# Patient Record
Sex: Male | Born: 1960
Health system: Southern US, Community
[De-identification: ages and names within clinical notes are randomized; demographics above are authoritative.]

## PROBLEM LIST (undated history)

## (undated) DIAGNOSIS — R569 Unspecified convulsions: Secondary | ICD-10-CM

## (undated) DIAGNOSIS — I10 Essential (primary) hypertension: Secondary | ICD-10-CM

## (undated) HISTORY — DX: Essential (primary) hypertension: I10

## (undated) HISTORY — PX: OTHER SURGICAL HISTORY: SHX169

## (undated) HISTORY — DX: Unspecified convulsions: R56.9

---

## 2000-09-14 ENCOUNTER — Encounter: Payer: Self-pay | Admitting: Emergency Medicine

## 2000-09-14 ENCOUNTER — Observation Stay (HOSPITAL_COMMUNITY): Admission: EM | Admit: 2000-09-14 | Discharge: 2000-09-15 | Payer: Self-pay | Admitting: Emergency Medicine

## 2008-06-10 ENCOUNTER — Ambulatory Visit: Payer: Self-pay | Admitting: Surgery

## 2008-06-14 ENCOUNTER — Emergency Department (HOSPITAL_COMMUNITY): Admission: EM | Admit: 2008-06-14 | Discharge: 2008-06-14 | Payer: Self-pay | Admitting: Emergency Medicine

## 2008-06-30 ENCOUNTER — Ambulatory Visit (HOSPITAL_COMMUNITY): Admission: RE | Admit: 2008-06-30 | Discharge: 2008-06-30 | Payer: Self-pay | Admitting: Cardiology

## 2008-06-30 ENCOUNTER — Encounter (INDEPENDENT_AMBULATORY_CARE_PROVIDER_SITE_OTHER): Payer: Self-pay | Admitting: Cardiology

## 2008-08-03 ENCOUNTER — Ambulatory Visit (HOSPITAL_COMMUNITY): Admission: RE | Admit: 2008-08-03 | Discharge: 2008-08-03 | Payer: Self-pay | Admitting: Rheumatology

## 2010-07-27 LAB — POCT I-STAT, CHEM 8
BUN: 13 mg/dL (ref 6–23)
Calcium, Ion: 1.26 mmol/L (ref 1.12–1.32)
Chloride: 104 mEq/L (ref 96–112)
Creatinine, Ser: 1.1 mg/dL (ref 0.4–1.5)
Glucose, Bld: 87 mg/dL (ref 70–99)
HCT: 46 % (ref 39.0–52.0)
Hemoglobin: 15.6 g/dL (ref 13.0–17.0)
Potassium: 4.4 mEq/L (ref 3.5–5.1)
Sodium: 139 mEq/L (ref 135–145)
TCO2: 30 mmol/L (ref 0–100)

## 2010-07-27 LAB — DIFFERENTIAL
Basophils Absolute: 0.2 10*3/uL — ABNORMAL HIGH (ref 0.0–0.1)
Basophils Relative: 2 % — ABNORMAL HIGH (ref 0–1)
Eosinophils Absolute: 0.1 10*3/uL (ref 0.0–0.7)
Eosinophils Relative: 1 % (ref 0–5)
Lymphocytes Relative: 23 % (ref 12–46)
Lymphs Abs: 2.1 10*3/uL (ref 0.7–4.0)
Monocytes Absolute: 1.1 10*3/uL — ABNORMAL HIGH (ref 0.1–1.0)
Monocytes Relative: 12 % (ref 3–12)
Neutro Abs: 5.6 10*3/uL (ref 1.7–7.7)
Neutrophils Relative %: 62 % (ref 43–77)

## 2010-07-27 LAB — CBC
HCT: 44.4 % (ref 39.0–52.0)
Hemoglobin: 15.4 g/dL (ref 13.0–17.0)
MCHC: 34.8 g/dL (ref 30.0–36.0)
MCV: 95.1 fL (ref 78.0–100.0)
Platelets: 321 10*3/uL (ref 150–400)
RBC: 4.66 MIL/uL (ref 4.22–5.81)
RDW: 13 % (ref 11.5–15.5)
WBC: 9.1 10*3/uL (ref 4.0–10.5)

## 2010-07-27 LAB — D-DIMER, QUANTITATIVE: D-Dimer, Quant: 0.3 ug/mL-FEU (ref 0.00–0.48)

## 2010-08-29 NOTE — Procedures (Signed)
DUPLEX DEEP VENOUS EXAM - LOWER EXTREMITY   INDICATION:  Left lower extremity pain and swelling.   HISTORY:  Edema:  Yes.  Trauma/Surgery:  No.  Pain:  Yes.  PE:  No.  Previous DVT:  No.  Anticoagulants:  Other:   DUPLEX EXAM:                CFV   SFV   PopV  PTV    GSV                R  L  R  L  R  L  R   L  R  L  Thrombosis    0  0     0     0      0     0  Spontaneous   +  +     +     +      +     +  Phasic        +  +     +     +      +     +  Augmentation  +  +     +     +      +     +  Compressible  +  +     +     +      +     +  Competent     +  +     +     +      +     +   Legend:  + - yes  o - no  p - partial  D - decreased   IMPRESSION:  1. No evidence of DVT noted in the left leg.  2. Notified Kennon Rounds with results.    _____________________________  V. Charlena Cross, MD   MG/MEDQ  D:  06/10/2008  T:  06/10/2008  Job:  [id number]

## 2010-09-01 NOTE — H&P (Signed)
[place]  Patient:    John Mcneil, John Mcneil                       MRN: [id number] Adm. Date:  [id number] Disc. Date: [id number] Attending:  Norman Clay                         History and Physical  REASON FOR ADMISSION:  Chest pain.  HISTORY:  A 50 year old male with a known history of seizure disorder and smoking.  He has episodic indigestion type pain after eating hot dogs or precipitated by food.  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  He developed chest discomfort about an hour after eating a very large breakfast this morning.  Discomfort was described pressure/tightness in his mid and upper chest area which would wax and wane through the morning.  He took Rolaids.  The pain would be intermittent.  There is possibly some relief with belching.  The pain persisted on and off throughout the day and then it became associated with some sweating of his hands.  He became frightened after this and came to the emergency room.  EKG was unremarkable.  He was given multiple nitroglycerin, but did not appreciably change the pain and was eventually given a GI cocktail.  The pain went away about an hour after taking GI cocktail and nitroglycerin.  He normally is able to hunt and be active and does not normally have exertional chest pain and can do unlimited activities. Cholesterol status is unknown.  PAST MEDICAL HISTORY:  Remarkable for a seizure disorder.  Last seizure was around age 43.  No family doctor.  No hypertension, diabetes, or lipid disorder.  PAST SURGICAL HISTORY:  None.  ALLERGIES:  None.  MEDICATIONS:  Tegretol and phenobarbital.  FAMILY HISTORY:  Father is a diabetic who has had coronary artery bypass grafting.  Mother has had hypertension.  SOCIAL HISTORY:  He has been married twice.  No children the first marriage. Married to current wife for three years.  She has two children by  previous marriage.  He smokes a pack and a half of cigarettes per day for 18 years. Drinks rare alcohol.  Works as a Science writer for [employer].  REVIEW OF SYSTEMS:  He is not obese.  No ENT complaints.  He has noted some difficulty swallowing earlier today.  He does not have diarrhea or constipation.  No GU symptoms or problems.  No claudication or TIAs. Remainder of review of systems unremarkable.  PHYSICAL EXAMINATION  GENERAL:  Pleasant male in no acute distress.  VITAL SIGNS:  Blood pressure 130/80, pulse 76.  SKIN:  Warm and dry.  HEENT:  Normal.  Pharynx:  Negative.  NECK:  No carotid bruits.  Supple without masses, thyromegaly.  LUNGS:  Clear.  CARDIAC:  Normal S1, S2.  No S3 or murmur.  ABDOMEN:  Soft and nontender.  EXTREMITIES:  Femoral and distal pulses 2+.  No edema.  LABORATORIES:  A 12-lead ECG is normal.  Laboratories pending at time of dictation.  IMPRESSION: 1. Prolonged chest discomfort with atypical features, rule out angina.  Rule    out gastrointestinal source for pain. 2. Seizure disorder. 3. Smoker.  RECOMMENDATIONS:  Rule out MI unit.  Given Lovenox, aspirin, beta blockers. He is currently pain free.  Check serial enzymes.  If negative consider treadmill testing.  Add Protonix to regimen.  I have asked him to stop smoking.  Discussed methods of doing so. DD:  09/15/00 TD:  09/15/00 Job: [id number]

## 2013-02-23 ENCOUNTER — Other Ambulatory Visit: Payer: Self-pay | Admitting: Internal Medicine

## 2013-02-23 DIAGNOSIS — R319 Hematuria, unspecified: Secondary | ICD-10-CM

## 2013-03-18 ENCOUNTER — Other Ambulatory Visit: Payer: Self-pay

## 2013-04-28 ENCOUNTER — Encounter: Payer: Self-pay | Admitting: Internal Medicine

## 2013-08-18 ENCOUNTER — Ambulatory Visit (INDEPENDENT_AMBULATORY_CARE_PROVIDER_SITE_OTHER): Payer: 59 | Admitting: Family Medicine

## 2013-08-18 VITALS — BP 138/78 | HR 93 | Temp 98.8°F | Resp 18 | Ht 71.0 in | Wt 191.0 lb

## 2013-08-18 DIAGNOSIS — H9201 Otalgia, right ear: Secondary | ICD-10-CM

## 2013-08-18 DIAGNOSIS — H9209 Otalgia, unspecified ear: Secondary | ICD-10-CM

## 2013-08-18 NOTE — Progress Notes (Signed)
   Subjective:    Patient ID: John Mcneil, male    DOB: 1961/03/26, 53 y.o.   MRN: [id number]  HPI Patient presents today with 3 days of intermittent right ear pain. He has been using OTC drops for swimmers ear. Has noticed decreased hearing in right ear.Uses cotton swabs in ears daily.  Had ear infection 6 month ago and was given drops which resolved problem.   Receives regular primary care. Had physical last month. Has had intermittent elevated blood pressure readings.  PMH- childhood seizures PSH- none  SH- smokes almost 1 ppd for 20 years, works for [employer], enjoys his work, will be retiring 2/15.  Review of Systems No fever, no sore throat, no nasal congestion.    Objective:   Physical Exam  Vitals reviewed. Constitutional: He is oriented to person, place, and time. He appears well-developed and well-nourished. No distress.  HENT:  Head: Normocephalic and atraumatic.  Right Ear: External ear normal.  Left Ear: Tympanic membrane, external ear and ear canal normal.  Nose: Nose normal.  Mouth/Throat: Oropharynx is clear and moist.  Right ear with irritated, narrowed canal. Unable to visualize TM due to cerumen. Slightly tender to exam. Left ear with moderate amount cerumen, able to visualize TM.  Eyes: Conjunctivae are normal. Right eye exhibits no discharge. Left eye exhibits no discharge.  Neck: Normal range of motion. Neck supple.  Cardiovascular: Normal rate, regular rhythm and normal heart sounds.   Pulmonary/Chest: Effort normal and breath sounds normal.  Musculoskeletal: Normal range of motion.  Neurological: He is alert and oriented to person, place, and time.  Skin: Skin is warm and dry. He is not diaphoretic.  Psychiatric: He has a normal mood and affect. His behavior is normal. Judgment and thought content normal.   Right ear irrigated with good results. Improved hearing and decreased feeling of pressure noted.   Recheck of BP normal.     Assessment & Plan:  1.  Otalgia of right ear -improved with irrigation. Discussed ear care with patient, encouraged discontinuation of cotton swab use.   2. Elevated BP reading -Initial BP reading elevated, decreased as visit progressed. Discussed this with patient. He is to have BP rechecked in 1 month.   Emi Belfasteborah B. Gessner, FNP-BC  [facility], [facility]  08/18/2013 10:41 AM

## 2017-09-23 DIAGNOSIS — H66009 Acute suppurative otitis media without spontaneous rupture of ear drum, unspecified ear: Secondary | ICD-10-CM | POA: Diagnosis not present

## 2019-03-26 ENCOUNTER — Emergency Department (HOSPITAL_COMMUNITY): Payer: 59

## 2019-03-26 ENCOUNTER — Emergency Department (HOSPITAL_COMMUNITY)
Admission: EM | Admit: 2019-03-26 | Discharge: 2019-03-26 | Disposition: A | Discharge status: Home / Self Care | Payer: 59 | Attending: Emergency Medicine | Admitting: Emergency Medicine

## 2019-03-26 ENCOUNTER — Encounter (HOSPITAL_COMMUNITY): Payer: Self-pay

## 2019-03-26 ENCOUNTER — Other Ambulatory Visit: Payer: Self-pay

## 2019-03-26 DIAGNOSIS — E860 Dehydration: Secondary | ICD-10-CM | POA: Insufficient documentation

## 2019-03-26 DIAGNOSIS — I10 Essential (primary) hypertension: Secondary | ICD-10-CM | POA: Insufficient documentation

## 2019-03-26 DIAGNOSIS — F17210 Nicotine dependence, cigarettes, uncomplicated: Secondary | ICD-10-CM | POA: Diagnosis not present

## 2019-03-26 DIAGNOSIS — R0789 Other chest pain: Secondary | ICD-10-CM | POA: Diagnosis not present

## 2019-03-26 LAB — CBC
HCT: 50.9 % (ref 39.0–52.0)
Hemoglobin: 17.8 g/dL — ABNORMAL HIGH (ref 13.0–17.0)
MCH: 32 pg (ref 26.0–34.0)
MCHC: 35 g/dL (ref 30.0–36.0)
MCV: 91.5 fL (ref 80.0–100.0)
Platelets: 313 10*3/uL (ref 150–400)
RBC: 5.56 MIL/uL (ref 4.22–5.81)
RDW: 12.5 % (ref 11.5–15.5)
WBC: 9.2 10*3/uL (ref 4.0–10.5)
nRBC: 0 % (ref 0.0–0.2)

## 2019-03-26 LAB — BASIC METABOLIC PANEL
Anion gap: 10 (ref 5–15)
BUN: 14 mg/dL (ref 6–20)
CO2: 24 mmol/L (ref 22–32)
Calcium: 9.4 mg/dL (ref 8.9–10.3)
Chloride: 102 mmol/L (ref 98–111)
Creatinine, Ser: 1.35 mg/dL — ABNORMAL HIGH (ref 0.61–1.24)
GFR calc Af Amer: >60 mL/min (ref >60)
GFR calc non Af Amer: 58 mL/min — ABNORMAL LOW (ref >60)
Glucose, Bld: 126 mg/dL — ABNORMAL HIGH (ref 70–99)
Potassium: 3.6 mmol/L (ref 3.5–5.1)
Sodium: 136 mmol/L (ref 135–145)

## 2019-03-26 LAB — TROPONIN I (HIGH SENSITIVITY): Troponin I (High Sensitivity): 5 ng/L (ref <18)

## 2019-03-26 MED ORDER — NICOTINE 7 MG/24HR TD PT24
7.0000 mg | MEDICATED_PATCH | Freq: Once | TRANSDERMAL | Status: DC
  Administered 2019-03-26: 15:00:00 7 mg via TRANSDERMAL
  Filled 2019-03-26: qty 1

## 2019-03-26 NOTE — ED Provider Notes (Addendum)
[facility] EMERGENCY DEPARTMENT Provider Note   CSN: [id number] Arrival date & time: 03/26/19  1322     History Chief Complaint  Patient presents with  . Hypertension    John Mcneil is a 58 y.o. male   HPI  Patient is 58 year old male who does not see primary care regularly-last seen 5 years ago.  Presents today with no current symptoms.  Patient states that 6 days ago on Friday night he had an episode where he woke up with chest discomfort that felt like indigestion felt nauseous, sweaty and flushed states that he felt warm and his face.  Patient states that he passed some flatulence and felt much improved but did experience 5 to 7 seconds of left armpit pain.  This did not radiate he denies any chest pressure or shortness of breath during this episode.  Patient states he has had no similar episodes since denies any exertional shortness of breath or chest pain is exertional discomfort denies any recurrent indigestion.  States he has felt somewhat anxious since this episode however and last night felt very anxious and was unable to sleep.  Denies any sensation of heart palpitations.  Patient states that his symptoms felt like he had "high blood pressure "states that he has been told he has high blood pressure before and has had episodes of somewhat similar symptoms however states that he is not on any medications for his blood pressure currently.  Patient states that he drinks very little water primarily drinks caffeinated sodas.  States he has not had water in several days.  Smokes daily.  No illicit drug use infrequent alcohol use.  Patient states he is a generally anxious person but denies any acute anxiety inducing events.  States he is retired and has been for several years now.  States he had an echocardiogram done several years ago and was told that he had no cardiac issues was signed off by cardiology.  Patient is unsure where he had this done.     Past  Medical History:  Diagnosis Date  . Seizures (HCC)     There are no problems to display for this patient.   Past Surgical History:  Procedure Laterality Date  . spinal tap         History reviewed. No pertinent family history.  Social History   Tobacco Use  . Smoking status: Current Every Day Smoker    Packs/day: 0.50    Types: Cigarettes  Substance Use Topics  . Alcohol use: Yes    Comment: rare  . Drug use: Never    Home Medications Prior to Admission medications   Not on File    Allergies    Patient has no known allergies.  Review of Systems   Review of Systems  Constitutional: Negative for chills and fever.  HENT: Negative for congestion.   Respiratory: Negative for cough, chest tightness and shortness of breath.   Cardiovascular: Positive for chest pain.  Gastrointestinal: Negative for abdominal pain.  Endocrine:       Flushed sensation  Psychiatric/Behavioral: The patient is nervous/anxious.     Physical Exam Updated Vital Signs BP (!) 175/104   Pulse 85   Temp 98 F (36.7 C) (Oral)   Resp 11   SpO2 99%   Physical Exam Vitals and nursing note reviewed.  Constitutional:      General: He is not in acute distress.    Comments: Patient is 58 year old male appears stated age face mildly  flushed.  Speech normal pace and tone.  HENT:     Head: Normocephalic and atraumatic.     Nose: Nose normal.     Mouth/Throat:     Mouth: Mucous membranes are dry.  Eyes:     General: No scleral icterus. Cardiovascular:     Rate and Rhythm: Normal rate and regular rhythm.     Pulses: Normal pulses.     Heart sounds: Normal heart sounds.     Comments: Heart rate is 90 on my exam Pulmonary:     Effort: Pulmonary effort is normal. No respiratory distress.     Breath sounds: No wheezing.  Abdominal:     Palpations: Abdomen is soft.     Tenderness: There is no abdominal tenderness.  Musculoskeletal:     Cervical back: Normal range of motion.     Right  lower leg: No edema.     Left lower leg: No edema.     Comments: No lower extremity edema.  No leg swelling.  No tenderness to calf.  Negative Homans' sign.  Skin:    General: Skin is warm and dry.     Capillary Refill: Capillary refill takes less than 2 seconds.  Neurological:     General: No focal deficit present.     Mental Status: He is alert and oriented to person, place, and time. Mental status is at baseline.     Comments: Moves all 4 limbs.  Strength symmetric.  Sensation intact throughout. No focal neuro deficits.  Cranial nerves II through XII grossly intact.  Psychiatric:        Mood and Affect: Mood normal.        Behavior: Behavior normal.     ED Results / Procedures / Treatments   Labs (all labs ordered are listed, but only abnormal results are displayed) Labs Reviewed  BASIC METABOLIC PANEL - Abnormal; Notable for the following components:      Result Value   Glucose, Bld 126 (*)    Creatinine, Ser 1.35 (*)    GFR calc non Af Amer 58 (*)    All other components within normal limits  CBC - Abnormal; Notable for the following components:   Hemoglobin 17.8 (*)    All other components within normal limits  TROPONIN I (HIGH SENSITIVITY)  TROPONIN I (HIGH SENSITIVITY)    EKG EKG Interpretation  Date/Time:  Thursday March 26 2019 13:30:55 EST Ventricular Rate:  127 PR Interval:  138 QRS Duration: 82 QT Interval:  298 QTC Calculation: 433 R Axis:   90 Text Interpretation: Sinus tachycardia Possible Left atrial enlargement Rightward axis Borderline ECG Confirmed by Raeford RazorKohut, Stephen [phone]([id number]) on 03/26/2019 1:45:56 PM   Radiology DG Chest 2 View  Result Date: 03/26/2019 CLINICAL DATA:  Chest pain. EXAM: CHEST - 2 VIEW COMPARISON:  None. FINDINGS: The heart size and mediastinal contours are within normal limits. Both lungs are clear. No pneumothorax or pleural effusion is noted. The visualized skeletal structures are unremarkable. IMPRESSION: No active  cardiopulmonary disease. Electronically Signed   By: Lupita RaiderJames  Green Jr M.D.   On: 03/26/2019 14:22    Procedures Procedures (including critical care time)  Medications Ordered in ED Medications  nicotine (NICODERM CQ - dosed in mg/24 hr) patch 7 mg (7 mg Transdermal Patch Applied 03/26/19 1526)    ED Course  I have reviewed the triage vital signs and the nursing notes.  Pertinent labs & imaging results that were available during my care of the patient were reviewed  by me and considered in my medical decision making (see chart for details).    MDM Rules/Calculators/A&P                      Presents today for numerous symptoms that occurred approximately 6 days ago including chest pain, transient nosebleed, high blood pressure anxiety, flushed and hot sensation.   States he had an episode of being nervous last night.  Patient has elevated blood pressure today in ED but has no chest pain, shortness of breath, blurry vision, neuro deficits or other concerning symptoms.  Has benign physical exam other than mild tachycardia which resolved without intervention.  Patient does appear anxious on exam.  Patient has not seen primary care doctor in at least 5 years.  States he has never been on blood pressure medications before.  Suspect that patient has had elevated blood pressure for some time.  Patient is EKG independently reviewed by myself and Dr. Wilson Singer shows no evidence of ischemia.  Is tachycardic however this has resolved during visit.  Blood work is grossly within normal limits other than mild elevation in creatinine from last blood work which was 10 years ago.  However this only mildly increased.  Patient's urine is obviously dark and he states that he drinks almost no water on a daily basis.  Suspect that he is dehydrated this could be contributing to his tachycardia.  Troponin x1 is within normal limits.  Medication for repeat troponin patient has no current chest pain.  His last episode of  chest pain was 1 week ago.  No leukocytosis.  Hemoglobin elevated likely due to dehydration.  Blood pressure remains to be elevated.  Patient called and made appointment with primary care doctor while in ED.    I discussed this case with my attending physician who cosigned this note including patient's presenting symptoms, physical exam, and planned diagnostics and interventions. Attending physician stated agreement with plan or made changes to plan which were implemented.     This patient appears reasonably screened and I doubt any other medical condition requiring further workup, evaluation, or treatment in the ED at this time prior to discharge.   Patient's vitals are WNL apart from vital sign abnormalities discussed above, patient is in NAD, and able to ambulate in the ED at their baseline. Pain has been managed or a plan has been made for home management and has no complaints prior to discharge. Patient is comfortable with above plan and is stable for discharge at this time. All questions were answered prior to disposition. Results from the ER workup discussed with the patient face to face and all questions answered to the best of my ability. The patient is safe for discharge with strict return precautions. Patient appears safe for discharge with appropriate follow-up. Conveyed my impression with the patient and they voiced understanding and are agreeable to plan.   An After Visit Summary was printed and given to the patient.  Portions of this note were generated with [employer]. Dictation errors may occur despite best attempts at proofreading.    Final Clinical Impression(s) / ED Diagnoses Final diagnoses:  Hypertension, unspecified type  Dehydration    Rx / DC Orders ED Discharge Orders    None       Tedd Sias, [place] 03/26/19 1615    Tedd Sias, [place] 03/26/19 [place], [place] Kathol [place], [place] 03/26/19 1653    Virgel Manifold, MD 03/27/19 [phone]

## 2019-03-26 NOTE — ED Triage Notes (Signed)
Pt endorses episode of nosebleeding, gas, chest pain last Friday while on a golfing trip. Pt checked his BP and it was high at home last night and he got anxious and hot. Decided to come to the ED to be evaluated. Tachy in triage 120.

## 2019-03-26 NOTE — ED Notes (Signed)
Patient transported to X-ray 

## 2019-03-26 NOTE — Discharge Instructions (Addendum)
Please keep your appointment tomorrow with your primary care doctor  Quitting smoking has significant and immediate health benefits for men and women of all ages. The sooner you quit, the greater the benefits. People who quit smoking before age 56 reduce their risk of dying over the next 15 years by one-half, as compared with those who continue to smoke. As we discussed, smoking is the most beneficial change you can make for your health.    PREPARING TO QUIT Smoking is recognized as a chronic addictive disease, and for some (but not all) smokers it can be extremely challenging to quit. People can differ greatly in the way in which they smoke, their success in quitting, symptoms they have when trying to quit, and factors that may lead to relapse. Many people try to quit on their own, without any help from medications or other supports. The success rate in this situation is much lower. If you tried to quit on your own without success, seeking help the next time you try to quit can make the difference.  Once you decide to quit smoking, the first step is usually to set a quit date. This is the day when you will completely quit smoking. Ideally, this date should be in the next two weeks, although some people choose a special date that is significant to them (eg, a birthday, anniversary, or holiday).  Many doctors recommend stopping smoking all at once on your quit date. However, some people prefer to gradually reduce the number of cigarettes they smoke prior to the quit date.  Once you have set your quit date, there are things you can do to help you prepare. It can help to:  ?Talk with a health care provider about ways to quit smoking. Changing behaviors and taking a medication are the two main methods of quitting smoking. Using both methods together increases your chances of successfully quitting.  ?Think about what happened with your previous quit attempts. What worked? What did not work? What  contributed to relapse? Is there anything you have learned that you can do differently this time to be more successful?  ?Tell your family, friends, and coworkers about the plan to quit and ask for their support.  ?Prepare to deal with nicotine withdrawal symptoms, such as cravings for a cigarette. Other symptoms are more general and often not recognized as related to nicotine withdrawal. These include anxiety, difficulty sleeping, irritability, difficulty concentrating, restlessness, frustration or anger, and depression. These symptoms are reduced and more tolerable if you use a stop-smoking medication. These include over-the-counter medications, like nicotine patches, gum, or lozenges, or prescription medications like varenicline or bupropion. (See 'Medications for quitting' below.)  Withdrawal symptoms usually become manageable within a few weeks of stopping smoking completely. Symptoms generally peak in the first three days and decrease over the next three to four weeks, so keep in mind that it will get easier. Symptoms vary in intensity but are generally stronger in heavier smokers.  ?Prepare to deal with things that trigger smoking. Examples include having other smokers in the household or workplace, stressful situations, and drinking alcohol. If you are in the habit of smoking during the work day, it might be easier to quit during a vacation from work. If you live with someone else who smokes, consider asking that person not to smoke in the house or car to help you succeed in becoming smoke-free.  ?Anticipate occasional cravings for cigarettes; these can be intense and may occur periodically over a longer timeframe  than withdrawal symptoms. Cravings may be brought on by situations associated with smoking, by stress, or by drinking alcohol. These cravings are a common reason people relapse after quitting. It is important to remember that a craving will typically go away in a few minutes if you  distract yourself by doing something else for a few minutes.  ?Plan an exercise program and a healthy diet to minimize weight gain. Weight gain may occur because people tend to eat more after quitting. Typically, people gain two to five pounds in the first two weeks, followed by an additional four to seven pounds over the next four to five months. The average total weight gain is 8 to 10 pounds. While this can be frustrating, it can help to keep in mind that the benefits of quitting smoking are much greater than the risks associated with gaining weight. (See "Patient education: Exercise (Beyond the Basics)".)  BEHAVIORAL CHANGES TO HELP YOU QUIT You can make changes in your daily behavior to help you quit smoking on your own; some people also choose to participate in individual or group support sessions (see 'In-person support' below). Combining behavioral changes with a medication increases your chances of quitting successfully. (See 'Medications for quitting' below.)  Problem solving/skills training -- When preparing to quit, it is important to identify situations or activities that increase your risk of smoking or relapse. After identifying these situations, you may need to develop new coping skills. This may include one or more of the following:  ?Make lifestyle changes to reduce stress and improve quality of life, such as starting an exercise program or learning relaxation techniques. Vigorous exercise can enhance the ability to stop smoking and avoid relapse and also helps to minimize or avoid weight gain.  ?Minimize time with smokers and in places where smoking is allowed. People who live with smokers can consider negotiating with them to stop smoking at home or in the car.  ?Recognize that cravings frequently lead to relapse. Cravings can be prevented to some degree by avoiding situations associated with smoking, by minimizing stress, and by avoiding alcohol. Cravings will subside. Keep oral  substitutes (such as sugarless gum, carrots, sunflower seeds, etc) handy for when cravings develop.  ?Try to avoid thoughts like "having one cigarette will not hurt"; one cigarette typically leads to many more.  ?Have as much information as possible about what to expect during a quit attempt and how to cope during this time. These can easily be found online, by calling a smokers' quitline, or by talking with a health care provider or counselor. Support groups can be helpful. Some medical centers have patient resources or learning centers with self-help materials. (See 'Where to get more information' below.)  Support -- Having consistent support is extremely helpful in quitting smoking and staying off cigarettes. Support can come from family and friends, a health care provider, a Social worker, a telephone hotline (in the [place], 1-800-QUIT-NOW), a text messaging program (in the [place], sign up at [place]), online resources, and/or support groups. In addition to getting ongoing encouragement, it is important to have someone you can talk to about any problems you have while trying to quit, such as weight gain, lack of support from family and friends, or prolonged withdrawal symptoms.  In-person support -- Some people find it helpful to talk with a "coach" who can help support you throughout the process. This often involves regular visits beginning before your quit date and continuing for several months afterwards.  Group counseling  sessions are another option; many different organizations offer group programs. These typically include lectures, group meetings for mutual support, discussion of coping skills, and suggestions for preventing relapse.  Hypnosis and acupuncture -- Hypnosis and acupuncture are popular stop-smoking methods. Although there is not convincing scientific evidence that these are effective, some people who have not had success with other techniques find these  treatments to be helpful.

## 2019-04-01 NOTE — Progress Notes (Signed)
Patient referred by Deland Pretty, MD for hypertension, chest pain.  Subjective:   John Mcneil, male    DOB: Dec 06, 1960, 58 y.o.   MRN: [id number]   Chief Complaint  Patient presents with  . Chest Pain  . New Patient (Initial Visit)  . Hypertension     HPI  58 y.o. Caucasian male with hypertension, hyperlipidemia, tobacco abuse, remote history of seizures, left renal cyst with history of hematuria, referred for evaluation of chest pain.  He has been feeling "generally not well" for a week or two. He feels "anxious" without any specific chest pain or shortness of breath. He has an episode of left subaxillary sharp pain lasting for 5 seconds, that led him to go to [facility] ED on 03/26/2019. He was hypertensive there, EKG did not show ischemia, but patient was tachycardic.  Troponin x1 was normal.  CBC and BMP were suggestive of dehydration, hemoconcentration and pre-renal AKI.   Since then, he has not had any chest pain, even when walking 2-3 miles on treadmill. He continues to have hypertension and feels "anxious". He barely slept 1 hour last night. He notes that he has been having apneic spells at night, per his wife.   He is currently taking 1/2 of losartan 100 mg daily for hypertension.  He used to work as a Librarian, academic at [employer], now retired. He smokes daily, now down to 8 cigarettes/day. He has a prescription for Chantix that he has not filled yet.     Past Medical History:  Diagnosis Date  . Hypertension   . Seizures ([place])     Past Surgical History:  Procedure Laterality Date  . spinal tap       Social History   Tobacco Use  Smoking Status Current Every Day Smoker  . Packs/day: 0.50  . Years: 30.00  . Pack years: 15.00  . Types: Cigarettes  Smokeless Tobacco Never Used  Tobacco Comment   cut back to 8 daily    Social History   Substance and Sexual Activity  Alcohol Use Yes   Comment: rare     Family History  Problem Relation Age of Onset  .  Cancer Mother   . Diabetes Mother   . Heart disease Mother   . Heart attack Mother   . Hypertension Father   . CAD Father 57     Current Outpatient Medications on File Prior to Visit  Medication Sig Dispense Refill  . aspirin EC 81 MG tablet Take 81 mg by mouth daily.    . Calcium Carb-Cholecalciferol (CALCIUM 600-D PO) Take by mouth.    . Cholecalciferol (D3-1000) 25 MCG (1000 UT) capsule Take 2,000 Units by mouth daily.    Marland Kitchen losartan (COZAAR) 100 MG tablet Take 50 mg by mouth daily.     . Omega-3 Fatty Acids (FISH OIL) 1000 MG CAPS Take by mouth.     No current facility-administered medications on file prior to visit.    Cardiovascular and other pertinent studies:  EKG 04/03/2019: Sinus tachycardia 111 bpm.  EKG 03/26/2019: Sinus tachycardia 127 bpm.  Echocardiogram 2010: EF 60%. Rest normal.    Recent labs: 03/26/2019: Glucose 126, BUN/Cr 14/1.35. EGFR 58. Na/K 136/3.6.  H/H 17.8/50.9. MCV 91. Platelets 313 Trop HS 5  Review of Systems  Constitution: Negative for decreased appetite, malaise/fatigue, weight gain and weight loss.  HENT: Negative for congestion.   Eyes: Negative for visual disturbance.  Cardiovascular: Negative for chest pain, dyspnea on exertion, leg swelling, palpitations  and syncope.  Respiratory: Negative for cough.   Endocrine: Negative for cold intolerance.  Hematologic/Lymphatic: Does not bruise/bleed easily.  Skin: Negative for itching and rash.  Musculoskeletal: Negative for myalgias.  Gastrointestinal: Negative for abdominal pain, nausea and vomiting.  Genitourinary: Negative for dysuria.  Neurological: Negative for dizziness and weakness.  Psychiatric/Behavioral: The patient is not nervous/anxious.   All other systems reviewed and are negative.       Vitals:   04/03/19 0842 04/03/19 0851  BP: (!) 169/111 (!) 160/104  Pulse: (!) 120 (!) 110  SpO2: 98%      Body mass index is 24.75 kg/m. Filed Weights   04/03/19 0842    Weight: 175 lb (79.4 kg)     Objective:   Physical Exam  Constitutional: He is oriented to person, place, and time. He appears well-developed and well-nourished. No distress.  HENT:  Head: Normocephalic and atraumatic.  Eyes: Pupils are equal, round, and reactive to light. Conjunctivae are normal.  Neck: No JVD present.  Cardiovascular: Regular rhythm and intact distal pulses. Tachycardia present.  No murmur heard. Pulmonary/Chest: Effort normal and breath sounds normal. He has no wheezes. He has no rales.  Abdominal: Soft. Bowel sounds are normal. There is no rebound.  Musculoskeletal:        General: No edema.  Lymphadenopathy:    He has no cervical adenopathy.  Neurological: He is alert and oriented to person, place, and time. No cranial nerve deficit.  Skin: Skin is warm and dry.  Psychiatric: He has a normal mood and affect.  Nursing note and vitals reviewed.        Assessment & Recommendations:   58 y.o. Caucasian male with hypertension, hyperlipidemia, tobacco dependence, remote history of seizures, left renal cyst with history of hematuria, referred for evaluation of chest pain.  Hypertension, chest pain: Chest pain episode was for 5 seconds and has not happened again. No chest pain on treadmill walking for 2-3 miles. While the episode was likely non-angina, he has CAD risk factors with hypertension, tobacco abuse, and family history. Will obtain calcium score for risk stratification. Will check lipid panel. Continue Aspirin 81 mg daily.   Hypertension, tachycardia: I suspect anxiety is playing a role. Started metoprolol tartrate 25 mg bid, continue losartan 50 mg (1/2 of 100 mg). Check echocardiogram and 24 hr Holter monitor. Will refer to sleep study, given his hypertension and apneic episodes. Will repeat CBC, BMP, abnormal alst week.   Tobacco dependence: Tobacco cessation counseling:  - Currently smoking 1/2 packs/day   - Patient was informed of the dangers of  tobacco abuse including stroke, cancer, and MI, as well as benefits of tobacco cessation. - Patient is willing to quit at this time. - Approximately 5 mins were spent counseling patient cessation techniques. We discussed various methods to help quit smoking, including deciding on a date to quit, joining a support group, pharmacological agents. Patient would like to chantix, already has a prescription. - I will reassess his progress at the next follow-up visit   Thank you for referring the patient to [place]. Please feel free to contact with any questions.  Nigel Mormon, MD [facility] [place] Pager: [phone] Office: [phone]

## 2019-04-03 ENCOUNTER — Ambulatory Visit: Payer: 59

## 2019-04-03 ENCOUNTER — Ambulatory Visit: Payer: 59 | Admitting: Cardiology

## 2019-04-03 ENCOUNTER — Other Ambulatory Visit: Payer: Self-pay

## 2019-04-03 ENCOUNTER — Encounter: Payer: Self-pay | Admitting: Cardiology

## 2019-04-03 VITALS — BP 160/104 | HR 110 | Ht 70.5 in | Wt 175.0 lb

## 2019-04-03 DIAGNOSIS — R000 Tachycardia, unspecified: Secondary | ICD-10-CM | POA: Diagnosis not present

## 2019-04-03 DIAGNOSIS — F17200 Nicotine dependence, unspecified, uncomplicated: Secondary | ICD-10-CM

## 2019-04-03 DIAGNOSIS — F17210 Nicotine dependence, cigarettes, uncomplicated: Secondary | ICD-10-CM

## 2019-04-03 DIAGNOSIS — I10 Essential (primary) hypertension: Secondary | ICD-10-CM

## 2019-04-03 DIAGNOSIS — Z8249 Family history of ischemic heart disease and other diseases of the circulatory system: Secondary | ICD-10-CM

## 2019-04-03 DIAGNOSIS — R0681 Apnea, not elsewhere classified: Secondary | ICD-10-CM | POA: Diagnosis not present

## 2019-04-03 DIAGNOSIS — R079 Chest pain, unspecified: Secondary | ICD-10-CM | POA: Diagnosis not present

## 2019-04-03 MED ORDER — METOPROLOL TARTRATE 25 MG PO TABS: 25.0000 mg | ORAL_TABLET | Freq: Two times a day (BID) | ORAL | 3 refills | Status: DC

## 2019-04-07 DIAGNOSIS — R002 Palpitations: Secondary | ICD-10-CM | POA: Diagnosis not present

## 2019-04-13 LAB — CBC
Hematocrit: 52.4 % — ABNORMAL HIGH (ref 37.5–51.0)
Hemoglobin: 17.9 g/dL — ABNORMAL HIGH (ref 13.0–17.7)
MCH: 31.4 pg (ref 26.6–33.0)
MCHC: 34.2 g/dL (ref 31.5–35.7)
MCV: 92 fL (ref 79–97)
Platelets: 313 10*3/uL (ref 150–450)
RBC: 5.7 x10E6/uL (ref 4.14–5.80)
RDW: 12.4 % (ref 11.6–15.4)
WBC: 9.1 10*3/uL (ref 3.4–10.8)

## 2019-04-21 ENCOUNTER — Ambulatory Visit (INDEPENDENT_AMBULATORY_CARE_PROVIDER_SITE_OTHER): Payer: 59

## 2019-04-21 ENCOUNTER — Other Ambulatory Visit: Payer: Self-pay

## 2019-04-21 DIAGNOSIS — R000 Tachycardia, unspecified: Secondary | ICD-10-CM

## 2019-05-01 ENCOUNTER — Ambulatory Visit: Payer: 59 | Admitting: Cardiology

## 2019-05-07 ENCOUNTER — Ambulatory Visit: Payer: 59 | Admitting: Cardiology

## 2019-05-11 ENCOUNTER — Other Ambulatory Visit: Payer: Self-pay

## 2019-05-11 ENCOUNTER — Encounter: Payer: Self-pay | Admitting: Cardiology

## 2019-05-11 ENCOUNTER — Ambulatory Visit: Payer: 59 | Admitting: Cardiology

## 2019-05-11 VITALS — BP 156/95 | HR 85 | Temp 98.1°F | Ht 70.0 in | Wt 183.0 lb

## 2019-05-11 DIAGNOSIS — R079 Chest pain, unspecified: Secondary | ICD-10-CM | POA: Diagnosis not present

## 2019-05-11 DIAGNOSIS — Z8249 Family history of ischemic heart disease and other diseases of the circulatory system: Secondary | ICD-10-CM | POA: Diagnosis not present

## 2019-05-11 DIAGNOSIS — F17210 Nicotine dependence, cigarettes, uncomplicated: Secondary | ICD-10-CM

## 2019-05-11 DIAGNOSIS — I10 Essential (primary) hypertension: Secondary | ICD-10-CM | POA: Diagnosis not present

## 2019-05-11 DIAGNOSIS — R000 Tachycardia, unspecified: Secondary | ICD-10-CM

## 2019-05-11 DIAGNOSIS — F17200 Nicotine dependence, unspecified, uncomplicated: Secondary | ICD-10-CM

## 2019-05-11 NOTE — Progress Notes (Signed)
Patient referred by Deland Pretty, MD for hypertension, chest pain.  Subjective:   John Mcneil, male    DOB: Nov 09, 1960, 59 y.o.   MRN: [id number]   Chief Complaint  Patient presents with  . Hypertension  . Tachycardia  . Follow-up    3-4 weeks    HPI  59 y.o. Caucasian male with hypertension, hyperlipidemia, tobacco abuse, remote history of seizures, left renal cyst with history of hematuria, referred for evaluation of chest pain.  Work-up with echocardiogram and event monitor was unremarkable.  I had recommended calcium score scan for risk stratification, but he has not undergone this test.  He has made significant changes to his diet and lifestyle, cut back smoking to 5 cigarettes a day.  He has been walking on treadmill for 2-1/2 miles on a regular basis.  He has not had any recurrence of chest pain symptoms.  Blood pressure is elevated today, but was much lower at PCP visit last week.  He is on hydroxyzine for as needed use for anxiety through his PCP, and has had significant improvement in his anxiety symptoms.   Initial consultation HPI 04/03/2019: He has been feeling "generally not well" for a week or two. He feels "anxious" without any specific chest pain or shortness of breath. He has an episode of left subaxillary sharp pain lasting for 5 seconds, that led him to go to [facility] ED on 03/26/2019. He was hypertensive there, EKG did not show ischemia, but patient was tachycardic.  Troponin x1 was normal.  CBC and BMP were suggestive of dehydration, hemoconcentration and pre-renal AKI.   Since then, he has not had any chest pain, even when walking 2-3 miles on treadmill. He continues to have hypertension and feels "anxious". He barely slept 1 hour last night. He notes that he has been having apneic spells at night, per his wife.   He is currently taking 1/2 of losartan 100 mg daily for hypertension.  He used to work as a Librarian, academic at [employer], now retired. He smokes daily,  now down to 8 cigarettes/day. He has a prescription for Chantix that he has not filled yet.     Current Outpatient Medications on File Prior to Visit  Medication Sig Dispense Refill  . aspirin EC 81 MG tablet Take 81 mg by mouth daily.    . Calcium Carb-Cholecalciferol (CALCIUM 600-D PO) Take by mouth.    . Cholecalciferol (D3-1000) 25 MCG (1000 UT) capsule Take 2,000 Units by mouth daily.    Marland Kitchen HYDROXYZINE PAMOATE PO Take 25 mg by mouth every 8 (eight) hours.    Marland Kitchen losartan (COZAAR) 100 MG tablet Take 50 mg by mouth daily.     . metoprolol tartrate (LOPRESSOR) 25 MG tablet Take 1 tablet (25 mg total) by mouth 2 (two) times daily. 60 tablet 3  . Omega-3 Fatty Acids (FISH OIL) 1000 MG CAPS Take by mouth.     No current facility-administered medications on file prior to visit.    Cardiovascular and other pertinent studies:  Echocardiogram 04/21/2019: Left ventricle cavity is normal in size. Mild concentric hypertrophy of the left ventricle. Normal global wall motion. Normal LV systolic function with EF 55%. Doppler evidence of grade I (impaired) diastolic dysfunction, normal LAP.  No significant valvular abnormality.  No evidence of pulmonary hypertension.  24 hr Holter monitor 04/03/2019:  Dominant rhythm sinus.  HR 44-128 bpm. Avg HR 73 bpm.  No atrial fibrillation/atrial flutter/SVT/VT/high grade AV block, sinus pause >3sec noted.  Rare PAC seen.  Symptoms reported: None    EKG 04/03/2019: Sinus tachycardia 111 bpm.  Recent labs: 03/26/2019: Glucose 126, BUN/Cr 14/1.35. EGFR 58. Na/K 136/3.6.  H/H 17.8/50.9. MCV 91. Platelets 313 Trop HS 5  Review of Systems  Cardiovascular: Negative for chest pain, dyspnea on exertion, leg swelling, palpitations and syncope.        Vitals:   05/11/19 1027 05/11/19 1030  BP: (!) 156/100 (!) 156/95  Pulse: 81 85  Temp: 98.1 F (36.7 C)   SpO2: 99% 100%     Body mass index is 26.26 kg/m. Filed Weights   05/11/19 1027  Weight:  183 lb (83 kg)     Objective:   Physical Exam  Constitutional: He appears well-developed and well-nourished.  Neck: No JVD present.  Cardiovascular: Normal rate, regular rhythm, normal heart sounds and intact distal pulses.  No murmur heard. Pulmonary/Chest: Effort normal and breath sounds normal. He has no wheezes. He has no rales.  Musculoskeletal:        General: No edema.  Nursing note and vitals reviewed.        Assessment & Recommendations:   59 y.o. Caucasian male with hypertension, hyperlipidemia, tobacco dependence, remote history of seizures, left renal cyst with history of hematuria, referred for evaluation of chest pain.  Chest pain: Chest pain episode was for 5 seconds and has not happened again. No chest pain on treadmill walking for 2-3 miles.  He has had improvement in his symptoms with treatment of anxiety.  Pending calcium score scan.  If calcium score is 0, could stop aspirin.    Hypertension, tachycardia: Tachycardia resolved.   Reassuring echocardiogram and event monitor. Continue follow-up with PCP for management of hypertension.  Tobacco dependence: Re-emphasized cessation.    I will see him on as-needed basis.  Nigel Mormon, MD [facility]. [place] Pager: [phone] Office: [phone]

## 2019-07-20 ENCOUNTER — Other Ambulatory Visit: Payer: Self-pay | Admitting: Cardiology

## 2019-07-20 DIAGNOSIS — R000 Tachycardia, unspecified: Secondary | ICD-10-CM

## 2019-07-20 DIAGNOSIS — I10 Essential (primary) hypertension: Secondary | ICD-10-CM

## 2020-01-07 ENCOUNTER — Other Ambulatory Visit: Payer: Self-pay

## 2020-01-07 ENCOUNTER — Encounter: Payer: Self-pay | Admitting: Cardiology

## 2020-01-07 ENCOUNTER — Ambulatory Visit: Payer: 59 | Admitting: Cardiology

## 2020-01-07 VITALS — BP 152/98 | HR 100 | Ht 70.0 in | Wt 182.0 lb

## 2020-01-07 DIAGNOSIS — R079 Chest pain, unspecified: Secondary | ICD-10-CM

## 2020-01-07 DIAGNOSIS — F17200 Nicotine dependence, unspecified, uncomplicated: Secondary | ICD-10-CM

## 2020-01-07 MED ORDER — PROPRANOLOL HCL 20 MG PO TABS: 20.0000 mg | ORAL_TABLET | Freq: Three times a day (TID) | ORAL | 2 refills | Status: DC | PRN

## 2020-01-07 MED ORDER — AMLODIPINE BESYLATE 10 MG PO TABS: 10.0000 mg | ORAL_TABLET | Freq: Every day | ORAL | 3 refills | Status: DC

## 2020-01-07 MED ORDER — CHANTIX STARTING MONTH PAK 0.5 MG X 11 & 1 MG X 42 PO TABS: ORAL_TABLET | ORAL | 0 refills | Status: DC

## 2020-01-07 NOTE — Progress Notes (Signed)
Patient referred by Deland Pretty, MD for hypertension, chest pain.  Subjective:   John Mcneil, male    DOB: [birth year], 59 y.o.   MRN: [id number]   Chief Complaint  Patient presents with  . Chest Pain    pt thinks his BP is too high  . Follow-up    Chest Pain  Pertinent negatives include no palpitations or syncope.   59 y.o. Caucasian male with hypertension, hyperlipidemia, tobacco dependence, anxiety, chest pain  Patient was last seen by me in early 2021.  Chest pain was thought to be related to anxiety.  I recommended calcium score scan, which she did not go.  For last few days, patient has had recurrent episodes of chest pain lasting for about 10 seconds.  His blood pressure has been elevated.  Unfortunately, he continues to smoke.    Current Outpatient Medications on File Prior to Visit  Medication Sig Dispense Refill  . aspirin EC 81 MG tablet Take 81 mg by mouth daily.    . Calcium Carb-Cholecalciferol (CALCIUM 600-D PO) Take by mouth.    . Cholecalciferol (D3-1000) 25 MCG (1000 UT) capsule Take 2,000 Units by mouth daily.    Marland Kitchen HYDROXYZINE PAMOATE PO Take 25 mg by mouth every 8 (eight) hours.    Marland Kitchen losartan (COZAAR) 100 MG tablet Take 50 mg by mouth daily.     . metoprolol tartrate (LOPRESSOR) 25 MG tablet TAKE 1 TABLET(25 MG) BY MOUTH TWICE DAILY 180 tablet 3  . Omega-3 Fatty Acids (FISH OIL) 1000 MG CAPS Take by mouth.     No current facility-administered medications on file prior to visit.    Cardiovascular and other pertinent studies:  EKG 01/07/2020: Sinus tachycardia 104 bpm  Echocardiogram 04/21/2019: Left ventricle cavity is normal in size. Mild concentric hypertrophy of the left ventricle. Normal global wall motion. Normal LV systolic function with EF 55%. Doppler evidence of grade I (impaired) diastolic dysfunction, normal LAP.  No significant valvular abnormality.  No evidence of pulmonary hypertension.  24 hr Holter monitor 04/03/2019:  Dominant  rhythm sinus.  HR 44-128 bpm. Avg HR 73 bpm.  No atrial fibrillation/atrial flutter/SVT/VT/high grade AV block, sinus pause >3sec noted.  Rare PAC seen.  Symptoms reported: None    EKG 04/03/2019: Sinus tachycardia 111 bpm.  Recent labs: 03/26/2019: Glucose 126, BUN/Cr 14/1.35. EGFR 58. Na/K 136/3.6.  H/H 17.8/50.9. MCV 91. Platelets 313 Trop HS 5  Review of Systems  Cardiovascular: Positive for chest pain. Negative for dyspnea on exertion, leg swelling, palpitations and syncope.        Vitals:   01/07/20 1553 01/07/20 1602  BP: (!) 171/102 (!) 152/98  Pulse: (!) 104 100  SpO2: 96%      Body mass index is 26.11 kg/m. Filed Weights   01/07/20 1553  Weight: 182 lb (82.6 kg)     Objective:   Physical Exam Vitals and nursing note reviewed.  Constitutional:      Appearance: He is well-developed.  Neck:     Vascular: No JVD.  Cardiovascular:     Rate and Rhythm: Regular rhythm. Tachycardia present.     Pulses: Intact distal pulses.     Heart sounds: Normal heart sounds. No murmur heard.   Pulmonary:     Effort: Pulmonary effort is normal.     Breath sounds: Normal breath sounds. No wheezing or rales.          Assessment & Recommendations:   59 y.o. Caucasian male with hypertension, hyperlipidemia,  tobacco dependence, anxiety, chest pain  Chest pain: While atypical, he has risk factors for CAD.  Will obtain exercise nuclear stress test. In addition, also recommend calcium score scan for stratification.  Hypertension: Stop metoprolol.  Started amlodipine 10 mg.  Continue losartan 100 mg daily.  In addition, he may use propranolol 20 mg 3 times daily as needed for anxiety/hypertension.  Tobacco dependence: Tobacco cessation counseling:  - Currently smoking 1/2 packs/day   - Patient was informed of the dangers of tobacco abuse including stroke, cancer, and MI, as well as benefits of tobacco cessation. - Patient is willing to quit at this time. -  Approximately 5 mins were spent counseling patient cessation techniques. We discussed various methods to help quit smoking, including deciding on a date to quit, joining a support group, pharmacological agents. Patient would like to use Chantix. - I will reassess his progress at the next follow-up visit  I will see him on as-needed basis.  Nigel Mormon, MD [facility] [place] Pager: [phone] Office: [phone]

## 2020-01-15 ENCOUNTER — Other Ambulatory Visit (HOSPITAL_COMMUNITY): Payer: Self-pay

## 2020-01-18 ENCOUNTER — Other Ambulatory Visit: Payer: 59

## 2020-01-22 ENCOUNTER — Other Ambulatory Visit (HOSPITAL_COMMUNITY): Payer: Self-pay

## 2020-01-22 ENCOUNTER — Other Ambulatory Visit (HOSPITAL_COMMUNITY)
Admission: RE | Admit: 2020-01-22 | Discharge: 2020-01-22 | Disposition: A | Discharge status: Home / Self Care | Payer: 59 | Source: Ambulatory Visit | Attending: Cardiology | Admitting: Cardiology

## 2020-01-22 DIAGNOSIS — Z20822 Contact with and (suspected) exposure to covid-19: Secondary | ICD-10-CM | POA: Insufficient documentation

## 2020-01-22 DIAGNOSIS — Z01812 Encounter for preprocedural laboratory examination: Secondary | ICD-10-CM | POA: Insufficient documentation

## 2020-01-22 LAB — SARS CORONAVIRUS 2 (TAT 6-24 HRS): SARS Coronavirus 2: NEGATIVE

## 2020-01-25 ENCOUNTER — Ambulatory Visit: Payer: 59

## 2020-01-25 ENCOUNTER — Other Ambulatory Visit: Payer: Self-pay

## 2020-01-25 DIAGNOSIS — R079 Chest pain, unspecified: Secondary | ICD-10-CM

## 2020-01-25 NOTE — Progress Notes (Signed)
Relayed information to patient. Patient voiced understanding.  

## 2020-02-04 ENCOUNTER — Ambulatory Visit: Payer: 59 | Admitting: Cardiology

## 2020-03-02 ENCOUNTER — Ambulatory Visit: Payer: 59 | Admitting: Cardiology

## 2020-03-24 ENCOUNTER — Ambulatory Visit: Payer: 59 | Admitting: Cardiology

## 2020-04-04 ENCOUNTER — Other Ambulatory Visit: Payer: Self-pay | Admitting: Cardiology

## 2020-04-04 DIAGNOSIS — R079 Chest pain, unspecified: Secondary | ICD-10-CM

## 2020-04-27 ENCOUNTER — Ambulatory Visit: Payer: 59 | Admitting: Cardiology

## 2020-04-27 ENCOUNTER — Other Ambulatory Visit: Payer: Self-pay | Admitting: Cardiology

## 2020-04-27 DIAGNOSIS — R079 Chest pain, unspecified: Secondary | ICD-10-CM

## 2020-05-30 ENCOUNTER — Other Ambulatory Visit: Payer: Self-pay | Admitting: Cardiology

## 2020-05-30 DIAGNOSIS — R079 Chest pain, unspecified: Secondary | ICD-10-CM

## 2020-08-01 ENCOUNTER — Other Ambulatory Visit: Payer: Self-pay | Admitting: Cardiology

## 2020-08-01 DIAGNOSIS — R079 Chest pain, unspecified: Secondary | ICD-10-CM

## 2020-08-20 ENCOUNTER — Other Ambulatory Visit: Payer: Self-pay | Admitting: Cardiology

## 2020-08-20 DIAGNOSIS — R079 Chest pain, unspecified: Secondary | ICD-10-CM

## 2020-12-18 ENCOUNTER — Other Ambulatory Visit: Payer: Self-pay | Admitting: Cardiology

## 2020-12-18 DIAGNOSIS — R079 Chest pain, unspecified: Secondary | ICD-10-CM

## 2021-04-26 ENCOUNTER — Other Ambulatory Visit: Payer: Self-pay | Admitting: Cardiology

## 2021-04-26 DIAGNOSIS — R079 Chest pain, unspecified: Secondary | ICD-10-CM

## 2021-06-08 ENCOUNTER — Other Ambulatory Visit: Payer: Self-pay

## 2021-06-08 ENCOUNTER — Encounter (HOSPITAL_COMMUNITY): Payer: Self-pay | Admitting: *Deleted

## 2021-06-08 ENCOUNTER — Emergency Department (HOSPITAL_COMMUNITY)
Admission: EM | Admit: 2021-06-08 | Discharge: 2021-06-08 | Disposition: A | Discharge status: Home / Self Care | Payer: 59 | Attending: Emergency Medicine | Admitting: Emergency Medicine

## 2021-06-08 DIAGNOSIS — Z7982 Long term (current) use of aspirin: Secondary | ICD-10-CM | POA: Insufficient documentation

## 2021-06-08 DIAGNOSIS — S0993XA Unspecified injury of face, initial encounter: Secondary | ICD-10-CM

## 2021-06-08 DIAGNOSIS — Z23 Encounter for immunization: Secondary | ICD-10-CM | POA: Insufficient documentation

## 2021-06-08 DIAGNOSIS — S01402A Unspecified open wound of left cheek and temporomandibular area, initial encounter: Secondary | ICD-10-CM | POA: Diagnosis not present

## 2021-06-08 DIAGNOSIS — W458XXA Other foreign body or object entering through skin, initial encounter: Secondary | ICD-10-CM | POA: Insufficient documentation

## 2021-06-08 MED ORDER — LIDOCAINE HCL (PF) 1 % IJ SOLN
5.0000 mL | Freq: Once | INTRAMUSCULAR | Status: AC
  Administered 2021-06-08: 5 mL via INTRADERMAL
  Filled 2021-06-08: qty 30

## 2021-06-08 MED ORDER — OXYCODONE-ACETAMINOPHEN 5-325 MG PO TABS
1.0000 | ORAL_TABLET | Freq: Once | ORAL | Status: AC
  Administered 2021-06-08: 1 via ORAL
  Filled 2021-06-08: qty 1

## 2021-06-08 MED ORDER — TETANUS-DIPHTH-ACELL PERTUSSIS 5-2.5-18.5 LF-MCG/0.5 IM SUSY
0.5000 mL | PREFILLED_SYRINGE | Freq: Once | INTRAMUSCULAR | Status: AC
  Administered 2021-06-08: 0.5 mL via INTRAMUSCULAR
  Filled 2021-06-08: qty 0.5

## 2021-06-08 NOTE — ED Provider Notes (Addendum)
°  Face-to-face evaluation   History: Patient presents for evaluation of fishhook embedded in left cheek.  Onset just prior to arrival.  Physical exam: Fishhook embedded in left cheek, appears fairly deep.  MDM: Evaluation for  Chief Complaint  Patient presents with   Fish Hook Left Jaw     Embedded fishhook.  This required removal.  Initially tried with snap technique, using 2 lines without recovery.  Then tried to capture the barb with a needle, was unable to.  Ultimately pushed hook/barb through and cut it off.  Was then able to back out the remainder of the hook.  .Foreign Body Removal  Date/Time: 06/08/2021 2:21 PM Performed by: Mancel Bale, MD Authorized by: Mancel Bale, MD  Consent: Verbal consent obtained. Consent given by: patient Intake: Left cheek. Anesthesia: local infiltration  Anesthesia: Local Anesthetic: lidocaine 2% without epinephrine Anesthetic total: 3 mL  Sedation: Patient sedated: no  Patient restrained: no Complexity: simple 1 objects recovered. Objects recovered: Fishhook Post-procedure assessment: foreign body removed Patient tolerance: patient tolerated the procedure well with no immediate complications Comments: Initially tried to snap the hook out, then back to the barb out, and ultimately had to push the hook through and cut the barb to back out the barrel of the needle.  Wound cleansed with Betadine and irrigated with saline afterwards    Medical screening examination/treatment/procedure(s) were conducted as a shared visit with non-physician practitioner(s) and myself.  I personally evaluated the patient during the encounter    Mancel Bale, MD 06/08/21 1420    Mancel Bale, MD 06/08/21 [phone]

## 2021-06-08 NOTE — ED Triage Notes (Signed)
3 prong fishing lure in left jaw.

## 2021-06-08 NOTE — ED Provider Notes (Signed)
[facility] DEPT Provider Note   CSN: [id number] Arrival date & time: 06/08/21  1147     History  Chief Complaint  Patient presents with   Fish Hook Left Jaw    John Mcneil is a 61 y.o. male.  61 year old male with no past medical history presents to the ED with left fishing lower stuck on his left cheek.  Patient reports fishing when suddenly his 5 lbs bass flopped and fling a hook into his left cheek. He reports some pain to the back, has not tried to remove this.  Last tetanus immunization is over 10 years ago. No other injury or complaint.   The history is provided by the patient and medical records.      Home Medications Prior to Admission medications   Medication Sig Start Date End Date Taking? Authorizing Provider  amLODipine (NORVASC) 10 MG tablet TAKE 1 TABLET(10 MG) BY MOUTH DAILY 12/20/20   Patwardhan, Reynold Bowen, MD  aspirin EC 81 MG tablet Take 81 mg by mouth daily.    [provider]  Calcium Carb-Cholecalciferol (CALCIUM 600-D PO) Take by mouth.    [provider]  Cholecalciferol (D3-1000) 25 MCG (1000 UT) capsule Take 2,000 Units by mouth daily.    [provider]  HYDROXYZINE PAMOATE PO Take 25 mg by mouth every 8 (eight) hours.    [provider]  losartan (COZAAR) 100 MG tablet Take 50 mg by mouth daily.  03/30/19   [provider]  Omega-3 Fatty Acids (FISH OIL) 1000 MG CAPS Take by mouth.    [provider]  propranolol (INDERAL) 20 MG tablet TAKE 1 TABLET(20 MG) BY MOUTH THREE TIMES DAILY AS NEEDED 04/26/21   Patwardhan, Reynold Bowen, MD  varenicline (CHANTIX STARTING MONTH PAK) 0.5 MG X 11 & 1 MG X 42 tablet Take one 0.5 mg tablet by mouth once daily for 3 days, then increase to one 0.5 mg tablet twice daily for 4 days, then increase to one 1 mg tablet twice daily. 01/07/20   Patwardhan, Reynold Bowen, MD      Allergies    Patient has no known allergies.    Review of Systems    Review of Systems  Constitutional:  Negative for fever.  Skin:  Positive for wound.   Physical Exam Updated Vital Signs BP (!) 158/92 (BP Location: Right Arm)    Pulse 94    Temp 98.7 F (37.1 C) (Oral)    Resp 16    Ht 5\' 11"  (1.803 m)    Wt 77.1 kg    SpO2 96%    BMI 23.71 kg/m  Physical Exam Vitals and nursing note reviewed.  Constitutional:      Appearance: Normal appearance.  HENT:     Head: Normocephalic.      Mouth/Throat:     Mouth: Mucous membranes are moist.  Cardiovascular:     Rate and Rhythm: Normal rate.  Pulmonary:     Effort: Pulmonary effort is normal.  Musculoskeletal:     Cervical back: Normal range of motion and neck supple.  Skin:    General: Skin is warm and dry.     Findings: Erythema present.  Neurological:     Mental Status: He is alert and oriented to person, place, and time.    ED Results / Procedures / Treatments   Labs (all labs ordered are listed, but only abnormal results are displayed) Labs Reviewed - No data to display  EKG None  Radiology No results found.  Procedures Procedures    Medications Ordered in ED Medications  lidocaine (PF) (XYLOCAINE) 1 % injection 5 mL (has no administration in time range)  Tdap (BOOSTRIX) injection 0.5 mL (0.5 mLs Intramuscular Given 06/08/21 1325)  oxyCODONE-acetaminophen (PERCOCET/ROXICET) 5-325 MG per tablet 1 tablet (1 tablet Oral Given 06/08/21 1325)    ED Course/ Medical Decision Making/ A&P                           Medical Decision Making Risk Prescription drug management.   Presents to the ED with a chief complaint of left took attached to his left cheek.  Patient was fishing, had an essential placement of the hook to his left cheek.  Multiple attempts were tried to remove the fishhook. My attending Dr. Eulis Foster, tried to niece, however we had to realize that pushed through the technique, where the hook was cut with pliers.  Patient had tetanus immunization updated on today's visit.   We did discuss signs of infection, given Percocet for pain while in the ED.  He is continuing with wound care at home, will need to follow-up with his physicians if anything changes, or worsen to the ED if he shows any signs of infection.  He is agreeable to plan treatment.  Patient stable for discharge.    Portions of this note were generated with [employer]. Dictation errors may occur despite best attempts at proofreading.  Final Clinical Impression(s) / ED Diagnoses Final diagnoses:  Fish hook in cheek    Rx / DC Orders ED Discharge Orders     None         Janeece Fitting, PA-C 06/08/21 1413    Daleen Bo, MD 06/08/21 1513

## 2021-06-08 NOTE — Discharge Instructions (Addendum)
You will need to continue doing warm compresses to your left cheek.  If you experience any fever, redness or worsening pain please return to the ED.

## 2021-06-26 ENCOUNTER — Other Ambulatory Visit: Payer: Self-pay | Admitting: Cardiology

## 2021-06-26 DIAGNOSIS — R079 Chest pain, unspecified: Secondary | ICD-10-CM

## 2021-07-04 ENCOUNTER — Other Ambulatory Visit: Payer: Self-pay | Admitting: Cardiology

## 2021-07-04 DIAGNOSIS — R079 Chest pain, unspecified: Secondary | ICD-10-CM

## 2021-10-24 ENCOUNTER — Other Ambulatory Visit: Payer: Self-pay | Admitting: Cardiology

## 2021-10-24 DIAGNOSIS — R079 Chest pain, unspecified: Secondary | ICD-10-CM

## 2021-11-06 IMAGING — CR DG CHEST 2V
2 series · 2 of 2 positions shown · non-contrast
Comparison: None.

CLINICAL DATA: Chest pain.

EXAM:
CHEST - 2 VIEW

[chest pa]
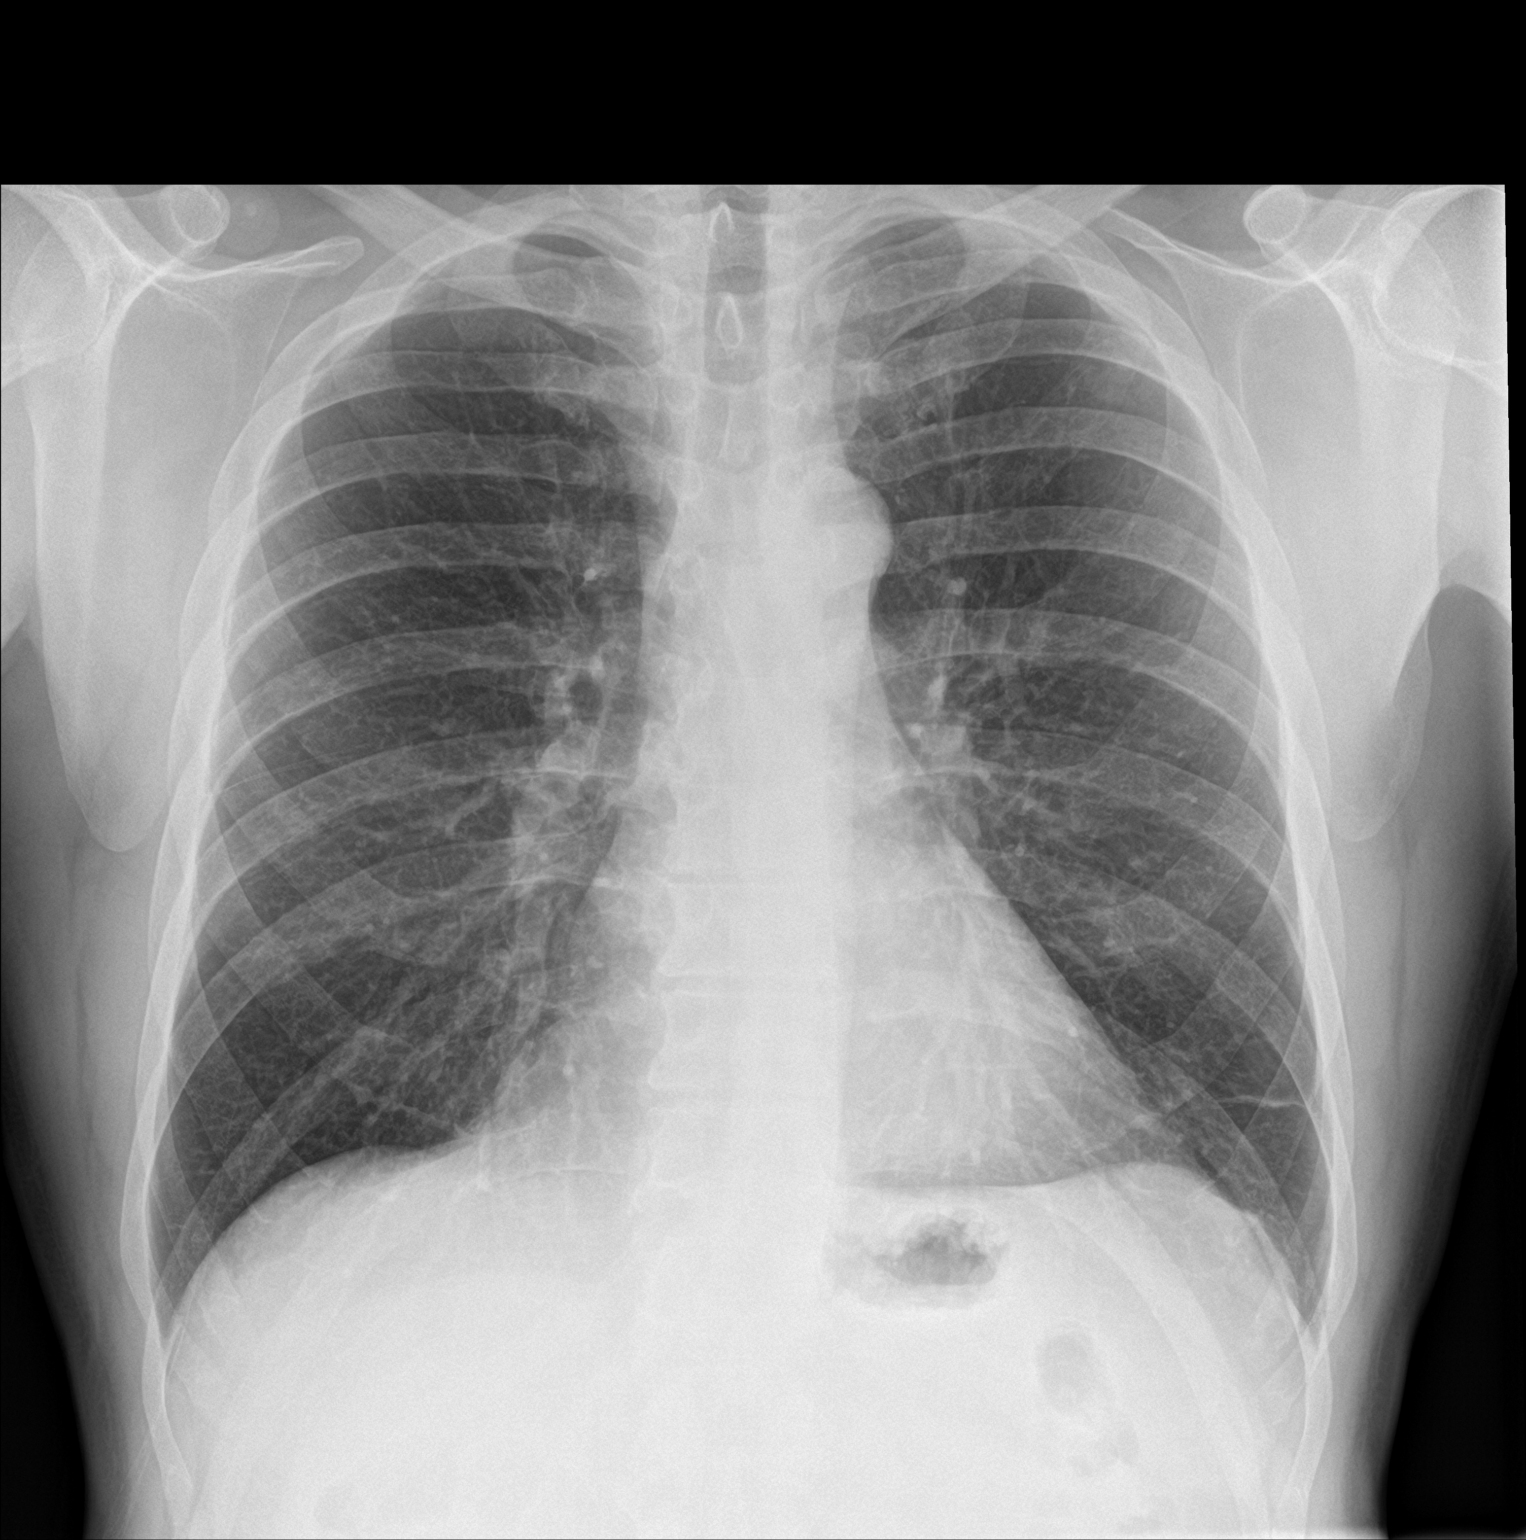

[chest lat]
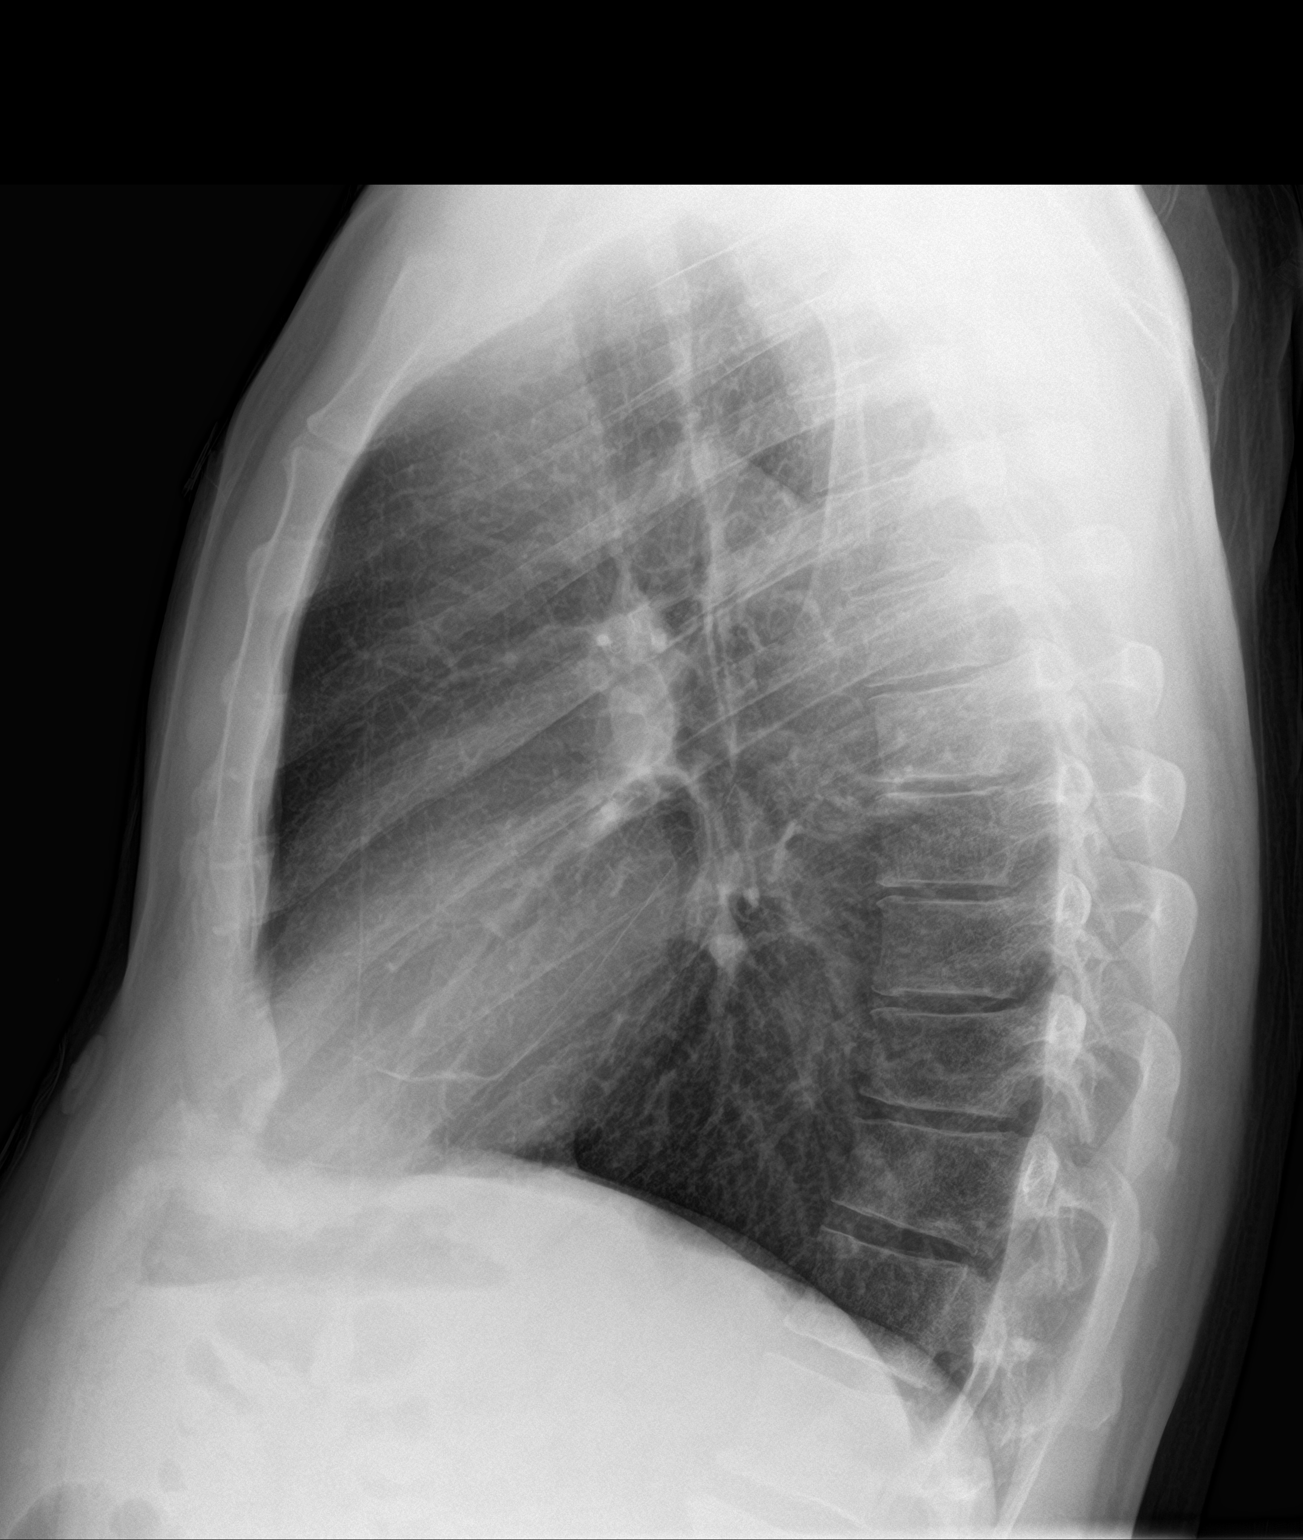

[2 of 2 positions shown; findings below may reference images not displayed]

FINDINGS: The heart size and mediastinal contours are within normal limits.
Both lungs are clear. No pneumothorax or pleural effusion is noted.
The visualized skeletal structures are unremarkable.
IMPRESSION: No active cardiopulmonary disease.

## 2022-05-22 ENCOUNTER — Other Ambulatory Visit: Payer: Self-pay | Admitting: Cardiology

## 2022-05-22 DIAGNOSIS — R079 Chest pain, unspecified: Secondary | ICD-10-CM

## 2022-12-11 ENCOUNTER — Other Ambulatory Visit: Payer: Self-pay | Admitting: Cardiology

## 2022-12-11 ENCOUNTER — Other Ambulatory Visit: Payer: Self-pay

## 2022-12-11 DIAGNOSIS — R079 Chest pain, unspecified: Secondary | ICD-10-CM

## 2022-12-11 MED ORDER — LOSARTAN POTASSIUM 100 MG PO TABS: 50.0000 mg | ORAL_TABLET | Freq: Every day | ORAL | 0 refills | Status: DC

## 2022-12-11 MED ORDER — AMLODIPINE BESYLATE 10 MG PO TABS: 10.0000 mg | ORAL_TABLET | Freq: Every day | ORAL | 0 refills | Status: DC

## 2022-12-13 ENCOUNTER — Other Ambulatory Visit: Payer: Self-pay | Admitting: Cardiology

## 2022-12-13 ENCOUNTER — Other Ambulatory Visit: Payer: Self-pay

## 2022-12-13 DIAGNOSIS — R079 Chest pain, unspecified: Secondary | ICD-10-CM

## 2022-12-13 MED ORDER — PROPRANOLOL HCL 20 MG PO TABS: 20.0000 mg | ORAL_TABLET | Freq: Three times a day (TID) | ORAL | 0 refills | Status: DC

## 2022-12-20 ENCOUNTER — Ambulatory Visit: Payer: 59 | Admitting: Cardiology

## 2022-12-20 ENCOUNTER — Encounter: Payer: Self-pay | Admitting: Cardiology

## 2022-12-20 VITALS — BP 162/99 | HR 79 | Resp 16 | Ht 71.0 in | Wt 188.0 lb

## 2022-12-20 DIAGNOSIS — I10 Essential (primary) hypertension: Secondary | ICD-10-CM

## 2022-12-20 DIAGNOSIS — R079 Chest pain, unspecified: Secondary | ICD-10-CM

## 2022-12-20 MED ORDER — LOSARTAN POTASSIUM 100 MG PO TABS: 100.0000 mg | ORAL_TABLET | Freq: Every day | ORAL | 2 refills | Status: DC

## 2022-12-20 MED ORDER — AMLODIPINE BESYLATE 10 MG PO TABS: 10.0000 mg | ORAL_TABLET | Freq: Every day | ORAL | 2 refills | Status: DC

## 2022-12-20 MED ORDER — PROPRANOLOL HCL 20 MG PO TABS: 20.0000 mg | ORAL_TABLET | Freq: Two times a day (BID) | ORAL | 2 refills | Status: DC

## 2022-12-20 NOTE — Progress Notes (Signed)
Yeah I always made I saw that in the past his   Patient referred by John Brunette, MD for hypertension, chest pain.  Subjective:   John Mcneil, male    DOB: Aug 31, 1960, 62 y.o.   MRN: [id number]   Chief Complaint  Patient presents with   Follow-up   Hypertension    Chest Pain  Pertinent negatives include no palpitations or syncope.   62 y.o. Caucasian male with hypertension, hyperlipidemia, tobacco dependence, anxiety, chest pain  Patient was last seen by me in 2021.  He has not seen his PCP lately.  Blood pressure is elevated.  He has been taking losartan 50 mg daily.  He denies any chest pain, shortness of breath symptoms.  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  He had an episode of anaphylaxis after bee sting last year, and is getting EpiPen ever since then.    Current Outpatient Medications:    amLODipine (NORVASC) 10 MG tablet, Take 1 tablet (10 mg total) by mouth daily., Disp: 14 tablet, Rfl: 0   aspirin EC 81 MG tablet, Take 81 mg by mouth every other day., Disp: , Rfl:    Calcium Carb-Cholecalciferol (CALCIUM 600-D PO), Take by mouth., Disp: , Rfl:    Cholecalciferol (D3-1000) 25 MCG (1000 UT) capsule, Take 2,000 Units by mouth daily., Disp: , Rfl:    Omega-3 Fatty Acids (FISH OIL) 1000 MG CAPS, Take by mouth., Disp: , Rfl:    propranolol (INDERAL) 20 MG tablet, TAKE 1 TABLET(20 MG) BY MOUTH THREE TIMES DAILY (Patient taking differently: 2 (two) times daily.), Disp: 270 tablet, Rfl: 1   losartan (COZAAR) 100 MG tablet, TAKE 1/2 TABLET(50 MG) BY MOUTH DAILY (Patient not taking: Reported on 12/20/2022), Disp: 45 tablet, Rfl: 0   varenicline (CHANTIX STARTING MONTH PAK) 0.5 MG X 11 & 1 MG X 42 tablet, Take one 0.5 mg tablet by mouth once daily for 3 days, then increase to one 0.5 mg tablet twice daily for 4 days, then increase to one 1 mg tablet twice daily. (Patient not taking: Reported on 12/20/2022), Disp: 53 tablet, Rfl: 0    Cardiovascular and other pertinent  studies:  EKG 12/20/2022: Sinus rhythm 79 bpm Occasional ectopic ventricular beat    Echocardiogram 04/21/2019: Left ventricle cavity is normal in size. Mild concentric hypertrophy of the left ventricle. Normal global wall motion. Normal LV systolic function with EF 55%. Doppler evidence of grade I (impaired) diastolic dysfunction, normal LAP.  No significant valvular abnormality.  No evidence of pulmonary hypertension.  24 hr Holter monitor 04/03/2019:  Dominant rhythm sinus.  HR 44-128 bpm. Avg HR 73 bpm.  No atrial fibrillation/atrial flutter/SVT/VT/high grade AV block, sinus pause >3sec noted.  Rare PAC seen.  Symptoms reported: None     EKG 04/03/2019: Sinus tachycardia 111 bpm.  Recent labs: 03/26/2019: Glucose 126, BUN/Cr 14/1.35. EGFR 58. Na/K 136/3.6.  H/H 17.8/50.9. MCV 91. Platelets 313 Trop HS 5  Review of Systems  Cardiovascular:  Negative for chest pain, dyspnea on exertion, leg swelling, palpitations and syncope.        Vitals:   12/20/22 1543  BP: (!) 162/99  Pulse: 79  Resp: 16  SpO2: 98%      Body mass index is 26.22 kg/m. Filed Weights   12/20/22 1543  Weight: 188 lb (85.3 kg)      Objective:   Physical Exam Vitals and nursing note reviewed.  Constitutional:      Appearance: He is well-developed.  Neck:  Vascular: No JVD.  Cardiovascular:     Rate and Rhythm: Regular rhythm. Tachycardia present.     Pulses: Intact distal pulses.     Heart sounds: Normal heart sounds. No murmur heard. Pulmonary:     Effort: Pulmonary effort is normal.     Breath sounds: Normal breath sounds. No wheezing or rales.  Musculoskeletal:     Right lower leg: No edema.     Left lower leg: No edema.          Assessment & Recommendations:   62 y.o. Caucasian male with hypertension, hyperlipidemia, tobacco dependence, anxiety, chest pain  Hypertension: He has been on propranolol 20 mg twice daily for anxiety/hypertension, okay to continue  same. Increase losartan to 100 mg daily, continue amlodipine 10 mg daily. Check BMP, lipid panel in 1 week. Further refills of antihypertensive medications can be done through Dr. Hall Busing office. I congratulated him on smoking cessation.     I will see him on as-needed basis.  [name], MD [facility] Pager: [phone] Office: [phone]

## 2023-02-05 ENCOUNTER — Telehealth: Payer: Self-pay | Admitting: Cardiology

## 2023-02-05 DIAGNOSIS — I10 Essential (primary) hypertension: Secondary | ICD-10-CM

## 2023-02-05 NOTE — Telephone Encounter (Signed)
Spoke with wife per DPR and she states at list visit patient thought you wanted him to stop his losartan and propranolol. Only take amlodipine. He stop taking losartan and propanolol and has only been taking amlodipine. His BP has been really elevated. When his BP is elevated it causes him to have anxiety.   I reviewed your note and it did not say to stop any medications.   Per your note-He has been on propranolol 20 mg twice daily for anxiety/hypertension, okay to continue same. Increase losartan to 100 mg daily, continue amlodipine 10 mg daily.  Wife verbalized understanding

## 2023-02-05 NOTE — Telephone Encounter (Signed)
Pt c/o medication issue:  1. Name of Medication:  amLODipine (NORVASC) 10 MG tablet  2. How are you currently taking this medication (dosage and times per day)?   3. Are you having a reaction (difficulty breathing--STAT)?   4. What is your medication issue?  Wife states PCP recommends going back on Amlodipine and stopping propranolol and Losartan. She mentions patient's BP this morning was 180-200 systolic.

## 2023-02-07 NOTE — Telephone Encounter (Signed)
Agree.  Can schedule follow-up with either me or Pharm.D. for hypertension management in next few weeks.  Thanks MJP

## 2023-09-24 ENCOUNTER — Other Ambulatory Visit: Payer: Self-pay | Admitting: Cardiology

## 2023-09-24 DIAGNOSIS — R079 Chest pain, unspecified: Secondary | ICD-10-CM

## 2023-12-22 ENCOUNTER — Other Ambulatory Visit: Payer: Self-pay | Admitting: Cardiology

## 2023-12-22 DIAGNOSIS — R079 Chest pain, unspecified: Secondary | ICD-10-CM

## 2023-12-24 ENCOUNTER — Other Ambulatory Visit: Payer: Self-pay

## 2023-12-24 DIAGNOSIS — R079 Chest pain, unspecified: Secondary | ICD-10-CM

## 2023-12-24 NOTE — Telephone Encounter (Signed)
 Patient need to contact the office for appointment for future refills.   Newman JINNY Lawrence, MD [facility] Pager: [phone] Office: [phone]

## 2024-03-24 ENCOUNTER — Other Ambulatory Visit: Payer: Self-pay | Admitting: Cardiology

## 2024-03-24 DIAGNOSIS — R079 Chest pain, unspecified: Secondary | ICD-10-CM

## 2024-03-26 ENCOUNTER — Other Ambulatory Visit: Payer: Self-pay | Admitting: Cardiology

## 2024-03-26 DIAGNOSIS — R079 Chest pain, unspecified: Secondary | ICD-10-CM

## 2024-04-21 ENCOUNTER — Other Ambulatory Visit: Payer: Self-pay | Admitting: Cardiology

## 2024-04-21 DIAGNOSIS — R079 Chest pain, unspecified: Secondary | ICD-10-CM

## 2024-04-30 ENCOUNTER — Other Ambulatory Visit: Payer: Self-pay | Admitting: Cardiology

## 2024-04-30 DIAGNOSIS — R079 Chest pain, unspecified: Secondary | ICD-10-CM

## 2024-04-30 NOTE — Telephone Encounter (Signed)
" °*  STAT* If patient is at the pharmacy, call can be transferred to refill team.   1. Which medications need to be refilled? (please list name of each medication and dose if known)   amLODipine  (NORVASC ) 10 MG tablet  propranolol  (INDERAL ) 20 MG tablet  losartan  (COZAAR ) 100 MG tablet   2. Would you like to learn more about the convenience, safety, & potential cost savings by using the [facility]?   3. Are you open to using the [facility] (Type [facility]. ).  4. Which pharmacy/location (including street and city if local pharmacy) is medication to be sent to?  [id number] - [place], [place] - [address] AT [address]   5. Do they need a 30 day or 90 day supply?    Patient stated he is out/almost out of these medications.  Patient has appointment scheduled with Dr. Elmira on 3/12.   "

## 2024-04-30 NOTE — Telephone Encounter (Signed)
 Requested Prescriptions   Signed Prescriptions Disp Refills   amLODipine  (NORVASC ) 10 MG tablet 90 tablet 0    Sig: TAKE 1 TABLET(10 MG) BY MOUTH DAILY. PT MUST SCHEDULE APPOINTMENT WITH CARDIOLOGY OR PCP FOR MORE REFILLS.    Authorizing Provider: ELMIRA NEWMAN PARAS    Ordering User: WILFRED, Donnald Tabar  C   losartan  (COZAAR ) 100 MG tablet 90 tablet 0    Sig: TAKE 1 TABLET(100 MG) BY MOUTH DAILY. PT MUST SCHEDULE APPOINTMENT WITH CARDIOLOGY OR PCP FOR ANY REFILLS    Authorizing Provider: ELMIRA NEWMAN PARAS    Ordering User: Argus Caraher  C   propranolol  (INDERAL ) 20 MG tablet 180 tablet 0    Sig: TAKE 1 TABLET(20 MG) BY MOUTH TWICE DAILY. PT MUST SCHEDULE A FOLLOW UP APPOINTMENT WITH CARDIOLOGY OR PCP FOR MORE REFILLS    Authorizing Provider: ELMIRA NEWMAN PARAS    Ordering User: Keaston Pile  C

## 2024-04-30 NOTE — Telephone Encounter (Signed)
 In accordance with refill protocols, please review and address the following requirements before this medication refill can be authorized:  Appointment  and Labs  Pt  scheduled 06/25/24 w Dr Elmira Overdue for Cr, K

## 2024-05-01 NOTE — Telephone Encounter (Signed)
 During his visit with me in 12/2023, I made him as needed.  As such, I would defer medication refills to his PCP.  I am unsure why he is scheduled to see me in March.  Thanks MJP

## 2024-06-25 ENCOUNTER — Ambulatory Visit: Admitting: Cardiology
